# Patient Record
Sex: Female | Born: 1950 | Race: White | Hispanic: No | Marital: Single | Smoking: Never smoker
Health system: Southern US, Community
[De-identification: ages and names within clinical notes are randomized; demographics above are authoritative.]

---

## 2011-12-29 ENCOUNTER — Ambulatory Visit: Payer: Self-pay | Admitting: Family Medicine

## 2013-02-13 ENCOUNTER — Ambulatory Visit: Payer: Self-pay | Admitting: Family Medicine

## 2013-02-22 ENCOUNTER — Ambulatory Visit: Payer: Self-pay | Admitting: Family Medicine

## 2017-12-19 ENCOUNTER — Ambulatory Visit: Payer: Medicare Other | Admitting: Podiatry

## 2017-12-19 ENCOUNTER — Ambulatory Visit: Payer: Self-pay

## 2017-12-19 ENCOUNTER — Encounter: Payer: Self-pay | Admitting: Podiatry

## 2017-12-19 VITALS — BP 170/92 | HR 64

## 2017-12-19 DIAGNOSIS — M722 Plantar fascial fibromatosis: Secondary | ICD-10-CM

## 2017-12-19 MED ORDER — MELOXICAM 15 MG PO TABS: 15.0000 mg | ORAL_TABLET | Freq: Every day | ORAL | 0 refills | Status: DC

## 2017-12-19 NOTE — Addendum Note (Signed)
Addended byMaury Dus: Robyn Galati L on: 12/19/2017 03:44 PM   Modules accepted: Orders

## 2017-12-19 NOTE — Progress Notes (Signed)
This patient presents the office with chief complaint of a painful left heel for about 6-8 weeks.  Patient states that she walks about a mile a day and started developing pain in her left heel.  She says she developed pain upon rising in the morning and standing from a sitting position.  She says she has stopped walking the last week due to the pain and there has been minimal improvement.  She also says she is purchased insoles to wearing her shoes.  She presents the office stating that she has pain on the bottom of her left foot. Patient states that the pain in her left heel is 7 out of 10.   The pain is localized to the heel.  She presents the office today for an evaluation and treatment of this painful left heel  Vascular  Dorsalis pedis and posterior tibial pulses are palpable  B/L.  Capillary return  WNL.  Temperature gradient is  WNL.  Skin turgor  WNL  Sensorium  Senn Weinstein monofilament wire  WNL. Normal tactile sensation.  Nail Exam  Patient has normal nails with no evidence of bacterial or fungal infection.  Orthopedic  Exam  Muscle tone and muscle strength  WNL.  No limitations of motion feet  B/L.  No crepitus or joint effusion noted.  Foot type is unremarkable and digits show no abnormalities. Palpable pain noted at the medial and lateral attachment plantar fascia left heel.  Skin  No open lesions.  Normal skin texture and turgor.  Plantar Faciitis left heel.    Initial exam x-rays taken reveal minimal calcification at the insertion of the plantar fascia left.  Discussed this condition with this patient.  Told her to do stretching and icing of her foot at home.  Recommended power step insoles to be worn in better shoes.  Prescription of Mobic was called into the pharmacy for this patient. She is to return to the office in 3 weeks and we will consider injection therapy at that time.   Helane GuntherGregory Rena Sweeden DPM

## 2018-01-09 ENCOUNTER — Ambulatory Visit: Payer: Medicare Other | Admitting: Podiatry

## 2018-01-09 ENCOUNTER — Encounter: Payer: Self-pay | Admitting: Podiatry

## 2018-01-09 DIAGNOSIS — M722 Plantar fascial fibromatosis: Secondary | ICD-10-CM | POA: Diagnosis not present

## 2018-01-09 NOTE — Progress Notes (Signed)
This patient presents the office for continued follow-up evaluation for plantar fasciitis left heel.  She says that she was treated with Mobic and power step insoles.  She also says she has decreased her walking during the last few weeks.  She relates that she is much better and that she has 90% improvement.  She is very pleased with her progress.  She presents the office today for continued evaluation and treatment of her left heel.  Vascular  Dorsalis pedis and posterior tibial pulses are palpable  B/L.  Capillary return  WNL.  Temperature gradient is  WNL.  Skin turgor  WNL  Sensorium  Senn Weinstein monofilament wire  WNL. Normal tactile sensation.  Nail Exam  Patient has normal nails with no evidence of bacterial or fungal infection.  Orthopedic  Exam  Muscle tone and muscle strength  WNL.  No limitations of motion feet  B/L.  No crepitus or joint effusion noted.  Foot type is unremarkable and digits show no abnormalities.  Bony prominences are unremarkable. No evidence of any palpable pain at the medial and lateral aspect of the plantar fascia left heel.  Skin  No open lesions.  Normal skin texture and turgor.  S/P Plantar fasciitis left heel.  ROV.  Patient was told  to continue the medicine and wearing her powerstep  Insoles.  She was also told to continue stretching and icing of her foot.  This patient does admit that she was having heartburn at night.  Therefore, I told her to take half of her Mobic daily.  Patient was told to return to the office if pain reoccurs.  RTC prn   Helane Gunther DPM

## 2018-01-10 ENCOUNTER — Other Ambulatory Visit: Payer: Self-pay | Admitting: Podiatry

## 2022-01-19 ENCOUNTER — Ambulatory Visit (LOCAL_COMMUNITY_HEALTH_CENTER): Payer: Medicare PPO

## 2022-01-19 DIAGNOSIS — Z23 Encounter for immunization: Secondary | ICD-10-CM

## 2022-01-19 DIAGNOSIS — Z719 Counseling, unspecified: Secondary | ICD-10-CM

## 2022-01-19 NOTE — Progress Notes (Signed)
  Are you feeling sick today? No   Have you ever received a dose of COVID-19 Vaccine? [employer], [place], [place], [place], Other) Yes  If yes, which vaccine and how many doses?   Pfizer 4 doses   Did you bring the vaccination record card or other documentation?  yes   Do you have a health condition or are undergoing treatment that makes you moderately or severely immunocompromised? This would include, but not be limited to: cancer, HIV, organ transplant, immunosuppressive therapy/high-dose corticosteroids, or moderate/severe primary immunodeficiency.  No  Have you received COVID-19 vaccine before or during hematopoietic cell transplant (HCT) or CAR-T-cell therapies? No  Have you ever had an allergic reaction to: (This would include a severe allergic reaction or a reaction that caused hives, swelling, or respiratory distress, including wheezing.) A component of a COVID-19 vaccine or a previous dose of COVID-19 vaccine? No   Have you ever had an allergic reaction to another vaccine (other thanCOVID-19 vaccine) or an injectable medication? (This would include a severe allergic reaction or a reaction that caused hives, swelling, or respiratory distress, including wheezing.)   No    Do you have a history of any of the following:  Myocarditis or Pericarditis No  Dermal fillers:  No  Multisystem Inflammatory Syndrome (MIS-C or MIS-A)? No  COVID-19 disease within the past 3 months? No  Vaccinated with monkeypox vaccine in the last 4 weeks? No  Covid Comirnaty 2023-24 12+ vaccine administered IM right deltoid and tolerated well.  States has had flu vaccine. VIS given.  [facility] updated and copy to pt.  Waited 15 minutes after vaccine administered. Tonny Branch, RN
